# Patient Record
Sex: Male | Born: 1945 | Race: White | Hispanic: No | Marital: Married
Health system: Southern US, Community
[De-identification: ages and names within clinical notes are randomized; demographics above are authoritative.]

---

## 2000-10-16 ENCOUNTER — Encounter: Payer: Self-pay | Admitting: Gastroenterology

## 2000-10-16 ENCOUNTER — Encounter: Admission: RE | Admit: 2000-10-16 | Discharge: 2000-10-16 | Payer: Self-pay | Admitting: Gastroenterology

## 2000-11-11 ENCOUNTER — Encounter: Payer: Self-pay | Admitting: Urology

## 2000-11-11 ENCOUNTER — Ambulatory Visit (HOSPITAL_COMMUNITY): Admission: RE | Admit: 2000-11-11 | Discharge: 2000-11-11 | Payer: Self-pay | Admitting: Urology

## 2002-09-19 ENCOUNTER — Ambulatory Visit (HOSPITAL_COMMUNITY): Admission: RE | Admit: 2002-09-19 | Discharge: 2002-09-19 | Payer: Self-pay | Admitting: Gastroenterology

## 2008-03-06 ENCOUNTER — Encounter: Admission: RE | Admit: 2008-03-06 | Discharge: 2008-03-06 | Payer: Self-pay | Admitting: Gastroenterology

## 2011-04-04 NOTE — Op Note (Signed)
   NAME:  Joshua, [name]                           ACCOUNT NO.:  [id number]   MEDICAL RECORD NO.:  [id number]                   PATIENT TYPE:  AMB   LOCATION:  ENDO                                 FACILITY:  [facility]   PHYSICIAN:  Danise Edge, M.D.                DATE OF BIRTH:  05-29-1946   DATE OF PROCEDURE:  09/19/2002  DATE OF DISCHARGE:                                 OPERATIVE REPORT   PROCEDURE:  Colonoscopy.   PROCEDURE INDICATION:  Mr. Joshua [name] is a 65 year old male, born 07-26-1946.  Mr. Joshua [name] passes fresh blood when he is constipated.  Approximately 10 years ago, proctocolonoscopy to the cecum was normal.   ENDOSCOPIST:  Charolett Bumpers, M.D.   PREMEDICATION:  Versed 10 mg, Demerol 50 mg.   ENDOSCOPE:  Olympus pediatric colonoscope.   DESCRIPTION OF PROCEDURE:  After obtaining informed consent, [name] was  placed in the left lateral decubitus position.  I administered intravenous  Demerol and intravenous Versed to achieve conscious sedation for the  procedure.  The patient's blood pressure, oxygen saturation, and cardiac  rhythm were monitored throughout the procedure and documented in the medical  record.   Anal inspection was normal.  Digital rectal examination was normal.  The  Olympus pediatric video colonoscope was introduced into the rectum and  advanced to the cecum.  Colonic preparation for the exam today was  excellent.   RECTUM:  Normal.  SIGMOID COLON AND DESCENDING COLON:  Normal.  SPLENIC FLEXURE:  Normal.  TRANSVERSE COLON:  Normal.  HEPATIC FLEXURE:  Normal.  ASCENDING COLON:  Normal.  CECUM AND ILEOCECAL VALVE:  Normal.   ASSESSMENT:  Normal proctocolonoscopy to the cecum.                                               Danise Edge, M.D.    MJ/MEDQ  D:  09/19/2002  T:  09/19/2002  Job:  [id number]

## 2012-08-07 ENCOUNTER — Ambulatory Visit (INDEPENDENT_AMBULATORY_CARE_PROVIDER_SITE_OTHER): Payer: MEDICARE | Admitting: *Deleted

## 2012-08-07 DIAGNOSIS — Z23 Encounter for immunization: Secondary | ICD-10-CM

## 2013-08-08 ENCOUNTER — Ambulatory Visit (INDEPENDENT_AMBULATORY_CARE_PROVIDER_SITE_OTHER): Payer: Medicare Other | Admitting: *Deleted

## 2013-08-08 DIAGNOSIS — Z23 Encounter for immunization: Secondary | ICD-10-CM

## 2014-08-17 ENCOUNTER — Ambulatory Visit (INDEPENDENT_AMBULATORY_CARE_PROVIDER_SITE_OTHER): Payer: Medicare Other | Admitting: Radiology

## 2014-08-17 DIAGNOSIS — Z23 Encounter for immunization: Secondary | ICD-10-CM

## 2015-01-31 DIAGNOSIS — M4806 Spinal stenosis, lumbar region: Secondary | ICD-10-CM | POA: Diagnosis not present

## 2015-01-31 DIAGNOSIS — Z23 Encounter for immunization: Secondary | ICD-10-CM | POA: Diagnosis not present

## 2015-01-31 DIAGNOSIS — Z86010 Personal history of colonic polyps: Secondary | ICD-10-CM | POA: Diagnosis not present

## 2015-01-31 DIAGNOSIS — Z0001 Encounter for general adult medical examination with abnormal findings: Secondary | ICD-10-CM | POA: Diagnosis not present

## 2015-01-31 DIAGNOSIS — Z136 Encounter for screening for cardiovascular disorders: Secondary | ICD-10-CM | POA: Diagnosis not present

## 2015-08-29 ENCOUNTER — Ambulatory Visit (INDEPENDENT_AMBULATORY_CARE_PROVIDER_SITE_OTHER): Payer: Medicare Other | Admitting: *Deleted

## 2015-08-29 DIAGNOSIS — Z23 Encounter for immunization: Secondary | ICD-10-CM | POA: Diagnosis not present

## 2016-02-07 DIAGNOSIS — I10 Essential (primary) hypertension: Secondary | ICD-10-CM | POA: Diagnosis not present

## 2016-02-07 DIAGNOSIS — Z1389 Encounter for screening for other disorder: Secondary | ICD-10-CM | POA: Diagnosis not present

## 2016-02-07 DIAGNOSIS — Z0000 Encounter for general adult medical examination without abnormal findings: Secondary | ICD-10-CM | POA: Diagnosis not present

## 2016-02-07 DIAGNOSIS — Z23 Encounter for immunization: Secondary | ICD-10-CM | POA: Diagnosis not present

## 2016-02-07 DIAGNOSIS — Z86010 Personal history of colonic polyps: Secondary | ICD-10-CM | POA: Diagnosis not present

## 2016-03-06 DIAGNOSIS — I10 Essential (primary) hypertension: Secondary | ICD-10-CM | POA: Diagnosis not present

## 2016-06-24 DIAGNOSIS — D1800 Hemangioma unspecified site: Secondary | ICD-10-CM | POA: Diagnosis not present

## 2016-06-24 DIAGNOSIS — D229 Melanocytic nevi, unspecified: Secondary | ICD-10-CM | POA: Diagnosis not present

## 2016-06-24 DIAGNOSIS — D485 Neoplasm of uncertain behavior of skin: Secondary | ICD-10-CM | POA: Diagnosis not present

## 2016-06-24 DIAGNOSIS — D2330 Other benign neoplasm of skin of unspecified part of face: Secondary | ICD-10-CM | POA: Diagnosis not present

## 2016-06-25 DIAGNOSIS — D1801 Hemangioma of skin and subcutaneous tissue: Secondary | ICD-10-CM | POA: Diagnosis not present

## 2016-08-01 ENCOUNTER — Ambulatory Visit (INDEPENDENT_AMBULATORY_CARE_PROVIDER_SITE_OTHER): Payer: Medicare Other

## 2016-08-01 DIAGNOSIS — Z23 Encounter for immunization: Secondary | ICD-10-CM | POA: Diagnosis not present

## 2016-08-12 DIAGNOSIS — D2239 Melanocytic nevi of other parts of face: Secondary | ICD-10-CM | POA: Diagnosis not present

## 2016-11-21 DIAGNOSIS — L308 Other specified dermatitis: Secondary | ICD-10-CM | POA: Diagnosis not present

## 2016-12-30 DIAGNOSIS — L308 Other specified dermatitis: Secondary | ICD-10-CM | POA: Diagnosis not present

## 2016-12-30 DIAGNOSIS — Z23 Encounter for immunization: Secondary | ICD-10-CM | POA: Diagnosis not present

## 2016-12-30 DIAGNOSIS — D485 Neoplasm of uncertain behavior of skin: Secondary | ICD-10-CM | POA: Diagnosis not present

## 2017-01-01 DIAGNOSIS — Q828 Other specified congenital malformations of skin: Secondary | ICD-10-CM | POA: Diagnosis not present

## 2017-01-28 DIAGNOSIS — R0683 Snoring: Secondary | ICD-10-CM | POA: Diagnosis not present

## 2017-01-28 DIAGNOSIS — G4700 Insomnia, unspecified: Secondary | ICD-10-CM | POA: Diagnosis not present

## 2017-02-11 DIAGNOSIS — Z0000 Encounter for general adult medical examination without abnormal findings: Secondary | ICD-10-CM | POA: Diagnosis not present

## 2017-02-11 DIAGNOSIS — Z1389 Encounter for screening for other disorder: Secondary | ICD-10-CM | POA: Diagnosis not present

## 2017-02-20 DIAGNOSIS — G4733 Obstructive sleep apnea (adult) (pediatric): Secondary | ICD-10-CM | POA: Diagnosis not present

## 2017-02-23 DIAGNOSIS — G4733 Obstructive sleep apnea (adult) (pediatric): Secondary | ICD-10-CM | POA: Diagnosis not present

## 2017-03-02 DIAGNOSIS — R42 Dizziness and giddiness: Secondary | ICD-10-CM | POA: Diagnosis not present

## 2017-03-04 DIAGNOSIS — D1801 Hemangioma of skin and subcutaneous tissue: Secondary | ICD-10-CM | POA: Diagnosis not present

## 2017-03-04 DIAGNOSIS — L718 Other rosacea: Secondary | ICD-10-CM | POA: Diagnosis not present

## 2017-03-04 DIAGNOSIS — L858 Other specified epidermal thickening: Secondary | ICD-10-CM | POA: Diagnosis not present

## 2017-03-04 DIAGNOSIS — L565 Disseminated superficial actinic porokeratosis (DSAP): Secondary | ICD-10-CM | POA: Diagnosis not present

## 2017-03-04 DIAGNOSIS — L821 Other seborrheic keratosis: Secondary | ICD-10-CM | POA: Diagnosis not present

## 2017-03-04 DIAGNOSIS — L308 Other specified dermatitis: Secondary | ICD-10-CM | POA: Diagnosis not present

## 2017-03-04 DIAGNOSIS — R42 Dizziness and giddiness: Secondary | ICD-10-CM | POA: Diagnosis not present

## 2017-05-11 DIAGNOSIS — G4733 Obstructive sleep apnea (adult) (pediatric): Secondary | ICD-10-CM | POA: Diagnosis not present

## 2017-06-04 DIAGNOSIS — L309 Dermatitis, unspecified: Secondary | ICD-10-CM | POA: Diagnosis not present

## 2017-08-19 DIAGNOSIS — Z23 Encounter for immunization: Secondary | ICD-10-CM | POA: Diagnosis not present

## 2017-08-31 DIAGNOSIS — R42 Dizziness and giddiness: Secondary | ICD-10-CM | POA: Diagnosis not present

## 2019-06-15 ENCOUNTER — Other Ambulatory Visit: Payer: Self-pay | Admitting: Internal Medicine

## 2019-06-15 DIAGNOSIS — R6889 Other general symptoms and signs: Secondary | ICD-10-CM

## 2019-06-15 DIAGNOSIS — I739 Peripheral vascular disease, unspecified: Secondary | ICD-10-CM

## 2019-06-17 ENCOUNTER — Ambulatory Visit: Payer: Medicare Other

## 2019-06-17 ENCOUNTER — Other Ambulatory Visit: Payer: Self-pay

## 2019-06-17 DIAGNOSIS — I739 Peripheral vascular disease, unspecified: Secondary | ICD-10-CM

## 2019-06-17 DIAGNOSIS — R6889 Other general symptoms and signs: Secondary | ICD-10-CM

## 2019-12-09 ENCOUNTER — Ambulatory Visit: Payer: Medicare PPO | Attending: Internal Medicine

## 2019-12-09 DIAGNOSIS — Z23 Encounter for immunization: Secondary | ICD-10-CM | POA: Insufficient documentation

## 2019-12-09 NOTE — Progress Notes (Signed)
   Covid-19 Vaccination Clinic  Name:  Joshua [name]    MRN: [id number] DOB: 05/18/1946  12/09/2019  Joshua [name] was observed post Covid-19 immunization for 15 minutes without incidence. He was provided with Vaccine Information Sheet and instruction to access the V-Safe system.   Joshua [name] was instructed to call 911 with any severe reactions post vaccine: Marland Kitchen Difficulty breathing  . Swelling of your face and throat  . A fast heartbeat  . A bad rash all over your body  . Dizziness and weakness    Immunizations Administered    Name Date Dose VIS Date Route   Pfizer COVID-19 Vaccine 12/09/2019  3:32 PM 0.3 mL 10/28/2019 Intramuscular   Manufacturer: [place]   Lot: [id number]   [place]: [id number]

## 2019-12-30 ENCOUNTER — Ambulatory Visit: Payer: Medicare PPO | Attending: Internal Medicine

## 2019-12-30 DIAGNOSIS — Z23 Encounter for immunization: Secondary | ICD-10-CM | POA: Insufficient documentation

## 2019-12-30 NOTE — Progress Notes (Signed)
   Covid-19 Vaccination Clinic  Name:  Joshua [name]    MRN: [id number] DOB: January 31, 1946  12/30/2019  Mr. Heileman was observed post Covid-19 immunization for 15 minutes without incidence. He was provided with Vaccine Information Sheet and instruction to access the V-Safe system.   Mr. Goetter was instructed to call 911 with any severe reactions post vaccine: Marland Kitchen Difficulty breathing  . Swelling of your face and throat  . A fast heartbeat  . A bad rash all over your body  . Dizziness and weakness    Immunizations Administered    Name Date Dose VIS Date Route   Pfizer COVID-19 Vaccine 12/30/2019 10:29 AM 0.3 mL 10/28/2019 Intramuscular   Manufacturer: [place]   Lot: [id number]   [place]: [id number]

## 2020-03-12 ENCOUNTER — Ambulatory Visit: Payer: Medicare PPO | Admitting: Orthopaedic Surgery

## 2020-03-12 ENCOUNTER — Ambulatory Visit (INDEPENDENT_AMBULATORY_CARE_PROVIDER_SITE_OTHER): Payer: Medicare PPO

## 2020-03-12 ENCOUNTER — Other Ambulatory Visit: Payer: Self-pay

## 2020-03-12 DIAGNOSIS — G8929 Other chronic pain: Secondary | ICD-10-CM

## 2020-03-12 DIAGNOSIS — M25561 Pain in right knee: Secondary | ICD-10-CM

## 2020-03-12 NOTE — Progress Notes (Signed)
Office Visit Note   Patient: Joshua [name]           Date of Birth: April 17, 1946           MRN: [id number] Visit Date: 03/12/2020              Requested by: [name], MD [address] [place],  [place] [id number] PCP: [name], MD   Assessment & Plan: Visit Diagnoses:  1. Chronic pain of right knee     Plan: Since he is doing so well after Dr. Inda Merlin steroid injection in his right knee, we will plan to just follow him conservatively for now.  He talked about his exercise routine and I agree with him being on his stationary bike.  I want him to really not do as much squats as he has been doing but otherwise avoid hills as well.  I would like to see him back in 2 months because I would always consider repeat injection then if needed.  However, if he develops locking catching or swelling and worsening symptoms he will let us know because given the well-maintained joint space in his knee I would recommend a MRI for further diagnostic purposes if needed.  Follow-Up Instructions: Return in about 2 months (around 05/12/2020).   Orders:  Orders Placed This Encounter  Procedures  . XR Knee 1-2 Views Right   No orders of the defined types were placed in this encounter.     Procedures: No procedures performed   Clinical Data: No additional findings.   Subjective: Chief Complaint  Patient presents with  . Right Knee - Pain  Patient is a very nice and pleasant 74 year old gentleman sent from Dr. Inda Merlin to evaluate and treat right knee pain.  He did had pain a year ago after jumping across a ditch and it really bothered him for for about 4 to 6 weeks but then the pain went away.  More recently he has been out walking and he started to get where he cannot walk for due to pain and swelling in his right knee.  He denies any locking catching.  He is very active.  When he saw Dr. Inda Merlin about 2 weeks ago he did have a steroid injection in his right knee and reports immediate  relief from that.  He denies any swelling now and said he is doing better overall.  He is not a diabetic.  He is never had surgery on this knee either.  HPI  Review of Systems There are no acute changes in medical status with no chest pain, shortness of breath, fever, chills, nausea, vomiting  Objective: Vital Signs: There were no vitals taken for this visit.  Physical Exam He is alert and oriented x3 and in no acute distress Ortho Exam Examination of his right knee only shows just some slight discomfort with McMurray's exam to the medial compartment of the knee but his range of motion is full and his knee is ligamentously stable.  There is no effusion.  There is no malalignment.  There is only slight patellofemoral crepitation.  There is slight pain in his knee with hyperflexion. Specialty Comments:  No specialty comments available.  Imaging: XR Knee 1-2 Views Right  Result Date: 03/12/2020 2 views of the right knee show well-maintained joint space with no acute findings at all.  There is no malalignment and the alignment is neutral.    PMFS History: There are no problems to display for this patient.  No past medical history on file.  No family history on file.   Social History   Occupational History  . Not on file  Tobacco Use  . Smoking status: Not on file  Substance and Sexual Activity  . Alcohol use: Not on file  . Drug use: Not on file  . Sexual activity: Not on file

## 2020-03-22 ENCOUNTER — Other Ambulatory Visit: Payer: Self-pay

## 2020-03-22 ENCOUNTER — Telehealth: Payer: Self-pay | Admitting: Orthopaedic Surgery

## 2020-03-22 DIAGNOSIS — G8929 Other chronic pain: Secondary | ICD-10-CM

## 2020-03-22 DIAGNOSIS — M25561 Pain in right knee: Secondary | ICD-10-CM

## 2020-03-22 NOTE — Telephone Encounter (Signed)
Patient called. He would like a MRI done on his knee. His number is [phone]

## 2020-03-22 NOTE — Telephone Encounter (Signed)
Patient aware I have put an order for MRI for him

## 2020-03-26 ENCOUNTER — Telehealth: Payer: Self-pay | Admitting: Orthopaedic Surgery

## 2020-03-26 NOTE — Telephone Encounter (Signed)
Pt called stating he's having an MRI on his knee but the pain has started going down his leg so the pt isn't sure if his knee is really the problem. Pt would like a call back to discuss this more. Pt also stated he has mychart but emailing would be a better wayto contact him if not by phone.   [name] [phone]

## 2020-03-26 NOTE — Telephone Encounter (Signed)
Please advise 

## 2020-04-19 DIAGNOSIS — D225 Melanocytic nevi of trunk: Secondary | ICD-10-CM | POA: Diagnosis not present

## 2020-04-19 DIAGNOSIS — Z85828 Personal history of other malignant neoplasm of skin: Secondary | ICD-10-CM | POA: Diagnosis not present

## 2020-04-19 DIAGNOSIS — L570 Actinic keratosis: Secondary | ICD-10-CM | POA: Diagnosis not present

## 2020-04-19 DIAGNOSIS — D1801 Hemangioma of skin and subcutaneous tissue: Secondary | ICD-10-CM | POA: Diagnosis not present

## 2020-04-19 DIAGNOSIS — L814 Other melanin hyperpigmentation: Secondary | ICD-10-CM | POA: Diagnosis not present

## 2020-04-19 DIAGNOSIS — L565 Disseminated superficial actinic porokeratosis (DSAP): Secondary | ICD-10-CM | POA: Diagnosis not present

## 2020-04-24 ENCOUNTER — Ambulatory Visit
Admission: RE | Admit: 2020-04-24 | Discharge: 2020-04-24 | Disposition: A | Discharge status: Home / Self Care | Payer: Medicare PPO | Source: Ambulatory Visit | Attending: Orthopaedic Surgery | Admitting: Orthopaedic Surgery

## 2020-04-24 ENCOUNTER — Other Ambulatory Visit: Payer: Self-pay

## 2020-04-24 DIAGNOSIS — M25561 Pain in right knee: Secondary | ICD-10-CM

## 2020-05-09 ENCOUNTER — Ambulatory Visit: Payer: Medicare PPO | Admitting: Orthopaedic Surgery

## 2020-05-09 ENCOUNTER — Other Ambulatory Visit: Payer: Self-pay

## 2020-05-09 ENCOUNTER — Encounter: Payer: Self-pay | Admitting: Orthopaedic Surgery

## 2020-05-09 DIAGNOSIS — S83241D Other tear of medial meniscus, current injury, right knee, subsequent encounter: Secondary | ICD-10-CM

## 2020-05-09 DIAGNOSIS — S83241A Other tear of medial meniscus, current injury, right knee, initial encounter: Secondary | ICD-10-CM | POA: Insufficient documentation

## 2020-05-09 NOTE — Progress Notes (Signed)
HPI: Mr. Dirocco returns today to go over the MRI of his right knee.  Again he continues to have pain medial aspect of the right knee no true mechanical symptoms.  He notes pain and swelling in the with ambulation.  He did undergo cortisone injection into the right knee which is giving no significant relief.  MRI right knee dated 04/24/2020 is reviewed with the patient both images and a knee model are used for visualization of the findings.  Is found to have moderate grade chondromalacia lateral patella facet including areas of local focal full-thickness cartilage loss along the periphery of the mid patella.  Trochlea appears well-maintained.  Medial compartment mild arthritic changes.  Lateral compartment well-preserved.  Complex tear involving the posterior horn of the medial meniscus with mild medial meniscal extrusion seen.  Physical exam: Good range of motion of the knee.  Tenderness along medial joint line.  No abnormal warmth or erythema.  Impression: Right knee medial meniscal tear  Plan: Discussed with patient findings and the MRI he like to proceed with a right knee arthroscopy.  He understands risk benefits surgery including but not limited to PE/DVT, wound infection, worsening pain prolonged pain and progression of arthritis.  We will see him back 1 week postop.  He is given Samella Parr card to schedule surgery.  Questions were encouraged and answered by Dr. Ninfa Linden myself

## 2020-05-14 ENCOUNTER — Ambulatory Visit: Payer: Medicare PPO | Admitting: Orthopaedic Surgery

## 2020-05-24 ENCOUNTER — Other Ambulatory Visit: Payer: Self-pay

## 2020-05-24 ENCOUNTER — Ambulatory Visit: Payer: Medicare PPO | Admitting: Sports Medicine

## 2020-05-24 VITALS — BP 162/88 | Ht 71.0 in | Wt 180.0 lb

## 2020-05-24 DIAGNOSIS — S83242A Other tear of medial meniscus, current injury, left knee, initial encounter: Secondary | ICD-10-CM | POA: Diagnosis not present

## 2020-05-25 NOTE — Progress Notes (Signed)
   Subjective:    Patient ID: Joshua [name], male    DOB: [birth year], 74 y.o.   MRN: [id number]  HPI chief complaint: Right knee pain  Very pleasant 74 year old male comes in today for a second opinion on right knee pain.  He injured the right knee in summer 2020 while jumping across a ditch.  At that time he felt some minor pain which was most noticeable with going up and down stairs.  His symptoms eventually improved up until about 3 to 4 months ago.  He then saw his primary care physician who gave him a cortisone injection which helped for about 5 to 6 days but then his pain returned.  He was then seen by Dr. Ninfa Linden and an MRI was ordered which shows a complex tear of the medial meniscus as well as a moderate grade lateral patellar facet arthropathy.  Arthroscopic meniscectomy and chondroplasty was recommended by Dr. Ninfa Linden.  Patient is somewhat hesitant to proceed with surgery.  He localizes his pain to the anterior medial joint line.  He denies locking catching or popping.  He has not noticed any swelling but does endorse stiffness with prolonged sitting.  No prior knee surgeries in the past.  He does have a compression sleeve which he has found to be minimally beneficial.  Past medical history reviewed Medications reviewed Allergies reviewed    Review of Systems As above    Objective:   Physical Exam  Well-developed, well-nourished.  No acute distress.  Awake alert and oriented x3.  Vital signs reviewed  Right knee: Full range of motion.  No obvious effusion.  He does have some tenderness to palpation along the anterior medial joint line.  Negative Thessaly's.  Knee is grossly stable to ligamentous exam.  Trace patellofemoral crepitus.  Neurovascularly intact distally.  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   I have given him a home exercise program to work on quad strengthening and I will see him back in the office in 6 weeks.  Even if he proceeds with surgery I think that presurgical strengthening of his quadriceps muscle will be helpful.  I had a long discussion with him regarding the results of meniscectomy in an arthritic knee.  He is not getting a lot of mechanical symptoms so some of his pain may be from arthritis.  However, if his quality of life remains impaired despite continuing conservative treatment then he may need to reconsider surgery.  I have encouraged him to call with questions or concerns prior to his follow-up visit.

## 2020-05-30 DIAGNOSIS — G4733 Obstructive sleep apnea (adult) (pediatric): Secondary | ICD-10-CM | POA: Diagnosis not present

## 2020-06-21 ENCOUNTER — Ambulatory Visit: Payer: Medicare PPO | Admitting: Sports Medicine

## 2020-06-21 ENCOUNTER — Other Ambulatory Visit: Payer: Self-pay

## 2020-06-21 VITALS — BP 160/90 | Ht 71.0 in | Wt 182.0 lb

## 2020-06-21 DIAGNOSIS — M1711 Unilateral primary osteoarthritis, right knee: Secondary | ICD-10-CM | POA: Diagnosis not present

## 2020-06-21 DIAGNOSIS — S83242A Other tear of medial meniscus, current injury, left knee, initial encounter: Secondary | ICD-10-CM | POA: Diagnosis not present

## 2020-06-21 NOTE — Progress Notes (Signed)
Office Visit Note   Patient: Joshua [name]           Date of Birth: [birth year]           MRN: [id number] Visit Date: 06/21/2020 Requested by: [name], MD [address] [place],  [place] [address] PCP: [name], MD  Subjective: Follow up for Chronic Right Knee Pain  HPI:  74 year old male presenting to clinic today to follow-up on concerns of chronic right knee pain.  Approximately 1 year ago, patient states he jumped over a brook while on a walk, and felt a sudden discomfort in his right knee.  He states that he "hobbled" for several days, however the pain seemed to resolve on its own after the initial presentation.  Approximately 4 months ago, the pain suddenly returned unprompted.  He has tried ice, as well as home exercises, using his stationary bike, cortisone injection, none of which offered significant improvement in his symptoms.  Patient describes that he is most bothered with bending his knee, or other squat like activities.  He endorses a painful "stabbing" sensation in the anterior medial aspect of his right knee.  Denies any catching sensation of the knee. He states he is very frustrated with how his knee is recovering, as he feels it is holding him back from his daily activity.  Previously, he was an avid walker throughout the day, and did his best to stay active.  He is worried that his knee pain is stopped him from his activity.               ROS:   All other systems were reviewed and are negative.  Objective: Vital Signs: BP (!) 160/90   Ht 5\' 11"  (1.803 m)   Wt 182 lb (82.6 kg)   BMI 25.38 kg/m   Physical Exam:  General:  Alert and oriented, in no acute distress. Cardiac: Appears well perfused. Pulm:  Breathing unlabored. Psy:  Normal mood, congruent affect. Skin:  No swelling, bruising, or rashes appreciated. Right knee: Normal gait, no obvious effusions appreciated. Mild patellar crepitus appreciated bilaterally with knee flexion and  extension. Pain with palpation of medial joint line on right.  No pain with palpation of lateral joint line. No pain or laxity with anterior or posterior drawer.  Similarly, no pain or laxity with varus or valgus stress across the knee. No obvious clicking or pain with McMurray's of right knee. Patient endorses popping sensation medially with Thessaly's testing. Right leg neurovascularly intact.  Imaging: None today.  Assessment & Plan: 74 year old male presenting to clinic today to follow-up on chronic right knee pain.  Patient with MRI displaying medial meniscal tear on right as well as arthritic disease and cartilage fissures beneath the patella.  At this time, it is possible his symptoms are mixed picture between arthritic pain and known meniscal injury.  Patient has been compliant with conservative therapy, however this has failed to offer adequate improvement in his pain.  Given his frustration, and inability to perform his daily walks, and daily exercise, it is reasonable for the patient to consider scope at this time. -Discussed follow-up with surgical team, to which patient is agreeable.  He will reach out to establish surgeon to schedule arthroscopy. -Patient is encouraged to return to clinic should he have any further questions or concerns.   Patient seen and evaluated with the sports medicine fellow.  I agree with the above plan of care.  Patient will follow up with  Dr. Ninfa Linden to discuss merits of arthroscopy and I will defer further treatment to his discretion.

## 2020-07-09 DIAGNOSIS — Z7189 Other specified counseling: Secondary | ICD-10-CM | POA: Diagnosis not present

## 2020-07-09 DIAGNOSIS — E559 Vitamin D deficiency, unspecified: Secondary | ICD-10-CM | POA: Diagnosis not present

## 2020-07-09 DIAGNOSIS — Z0000 Encounter for general adult medical examination without abnormal findings: Secondary | ICD-10-CM | POA: Diagnosis not present

## 2020-07-09 DIAGNOSIS — R030 Elevated blood-pressure reading, without diagnosis of hypertension: Secondary | ICD-10-CM | POA: Diagnosis not present

## 2020-07-09 DIAGNOSIS — G4700 Insomnia, unspecified: Secondary | ICD-10-CM | POA: Diagnosis not present

## 2020-07-09 DIAGNOSIS — Z1389 Encounter for screening for other disorder: Secondary | ICD-10-CM | POA: Diagnosis not present

## 2020-07-09 DIAGNOSIS — I739 Peripheral vascular disease, unspecified: Secondary | ICD-10-CM | POA: Diagnosis not present

## 2020-07-09 DIAGNOSIS — E538 Deficiency of other specified B group vitamins: Secondary | ICD-10-CM | POA: Diagnosis not present

## 2020-07-09 DIAGNOSIS — R7309 Other abnormal glucose: Secondary | ICD-10-CM | POA: Diagnosis not present

## 2020-07-09 DIAGNOSIS — R202 Paresthesia of skin: Secondary | ICD-10-CM | POA: Diagnosis not present

## 2020-07-09 DIAGNOSIS — G4733 Obstructive sleep apnea (adult) (pediatric): Secondary | ICD-10-CM | POA: Diagnosis not present

## 2020-08-02 ENCOUNTER — Encounter: Payer: Self-pay | Admitting: Family Medicine

## 2020-08-02 ENCOUNTER — Other Ambulatory Visit: Payer: Self-pay | Admitting: Orthopaedic Surgery

## 2020-08-02 DIAGNOSIS — M23221 Derangement of posterior horn of medial meniscus due to old tear or injury, right knee: Secondary | ICD-10-CM | POA: Diagnosis not present

## 2020-08-02 DIAGNOSIS — S83231A Complex tear of medial meniscus, current injury, right knee, initial encounter: Secondary | ICD-10-CM | POA: Diagnosis not present

## 2020-08-02 DIAGNOSIS — M948X6 Other specified disorders of cartilage, lower leg: Secondary | ICD-10-CM | POA: Diagnosis not present

## 2020-08-02 MED ORDER — HYDROCODONE-ACETAMINOPHEN 5-325 MG PO TABS: 1.0000 | ORAL_TABLET | Freq: Four times a day (QID) | ORAL | 0 refills | Status: AC | PRN

## 2020-08-08 ENCOUNTER — Encounter: Payer: Self-pay | Admitting: Orthopaedic Surgery

## 2020-08-08 ENCOUNTER — Ambulatory Visit (INDEPENDENT_AMBULATORY_CARE_PROVIDER_SITE_OTHER): Payer: Medicare PPO | Admitting: Orthopaedic Surgery

## 2020-08-08 DIAGNOSIS — S83241D Other tear of medial meniscus, current injury, right knee, subsequent encounter: Secondary | ICD-10-CM

## 2020-08-08 DIAGNOSIS — Z9889 Other specified postprocedural states: Secondary | ICD-10-CM

## 2020-08-08 NOTE — Progress Notes (Signed)
The patient comes in today 1 week status post a right knee arthroscopy with a partial lateral meniscectomy.  He is an active 74 year old gentleman reports that he is doing well overall.  On examination his incisions look good his remove the sutures.  He does have a moderate right knee joint effusion postoperatively.  I was able to aspirate 60 cc of fluid from the knee.  Talked about his knee in detail and I went over the arthroscopy pictures with him.  He will work on activity modification and quad strengthening exercises and will increase his activities as comfort allows.  We will see him back in 4 weeks to make sure he is doing well.

## 2020-09-05 ENCOUNTER — Encounter: Payer: Self-pay | Admitting: Orthopaedic Surgery

## 2020-09-05 ENCOUNTER — Ambulatory Visit (INDEPENDENT_AMBULATORY_CARE_PROVIDER_SITE_OTHER): Payer: Medicare PPO | Admitting: Orthopaedic Surgery

## 2020-09-05 VITALS — Ht 71.0 in | Wt 182.0 lb

## 2020-09-05 DIAGNOSIS — Z9889 Other specified postprocedural states: Secondary | ICD-10-CM

## 2020-09-05 NOTE — Progress Notes (Signed)
Patient is now around 5 weeks status post a right knee arthroscopy with a partial medial meniscectomy.  He is a very active 74 year old gentleman.  He says he still having moderate achy pain and he would like to have home exercise program prescribed to him.  Examination of his right operative knee does show moderate effusion.  I was able to aspirate 60 to 70 cc of fluid from his knee and then place a steroid injection in it.  He is already got good relief overall and good range of motion.  I was able to show him quad strengthening exercises to try at home.  All questions and concerns were answered and addressed.  At this point follow-up can be as needed.  He understands that if he has recurrence of his effusion or any other issues to let us know and come see [place].

## 2021-01-07 DIAGNOSIS — H2513 Age-related nuclear cataract, bilateral: Secondary | ICD-10-CM | POA: Diagnosis not present

## 2021-01-07 DIAGNOSIS — H5203 Hypermetropia, bilateral: Secondary | ICD-10-CM | POA: Diagnosis not present

## 2021-02-13 DIAGNOSIS — Z8249 Family history of ischemic heart disease and other diseases of the circulatory system: Secondary | ICD-10-CM | POA: Diagnosis not present

## 2021-02-13 DIAGNOSIS — Z809 Family history of malignant neoplasm, unspecified: Secondary | ICD-10-CM | POA: Diagnosis not present

## 2021-02-13 DIAGNOSIS — M1990 Unspecified osteoarthritis, unspecified site: Secondary | ICD-10-CM | POA: Diagnosis not present

## 2021-02-13 DIAGNOSIS — Z7982 Long term (current) use of aspirin: Secondary | ICD-10-CM | POA: Diagnosis not present

## 2021-02-13 DIAGNOSIS — Z833 Family history of diabetes mellitus: Secondary | ICD-10-CM | POA: Diagnosis not present

## 2021-02-13 DIAGNOSIS — R030 Elevated blood-pressure reading, without diagnosis of hypertension: Secondary | ICD-10-CM | POA: Diagnosis not present

## 2021-02-13 DIAGNOSIS — G4733 Obstructive sleep apnea (adult) (pediatric): Secondary | ICD-10-CM | POA: Diagnosis not present

## 2021-02-13 DIAGNOSIS — Z823 Family history of stroke: Secondary | ICD-10-CM | POA: Diagnosis not present

## 2021-02-13 DIAGNOSIS — N529 Male erectile dysfunction, unspecified: Secondary | ICD-10-CM | POA: Diagnosis not present

## 2021-04-10 DIAGNOSIS — G4733 Obstructive sleep apnea (adult) (pediatric): Secondary | ICD-10-CM | POA: Diagnosis not present

## 2021-04-30 DIAGNOSIS — D1801 Hemangioma of skin and subcutaneous tissue: Secondary | ICD-10-CM | POA: Diagnosis not present

## 2021-04-30 DIAGNOSIS — L814 Other melanin hyperpigmentation: Secondary | ICD-10-CM | POA: Diagnosis not present

## 2021-04-30 DIAGNOSIS — L570 Actinic keratosis: Secondary | ICD-10-CM | POA: Diagnosis not present

## 2021-04-30 DIAGNOSIS — D0422 Carcinoma in situ of skin of left ear and external auricular canal: Secondary | ICD-10-CM | POA: Diagnosis not present

## 2021-04-30 DIAGNOSIS — D485 Neoplasm of uncertain behavior of skin: Secondary | ICD-10-CM | POA: Diagnosis not present

## 2021-04-30 DIAGNOSIS — Z85828 Personal history of other malignant neoplasm of skin: Secondary | ICD-10-CM | POA: Diagnosis not present

## 2021-04-30 DIAGNOSIS — D225 Melanocytic nevi of trunk: Secondary | ICD-10-CM | POA: Diagnosis not present

## 2021-04-30 DIAGNOSIS — L565 Disseminated superficial actinic porokeratosis (DSAP): Secondary | ICD-10-CM | POA: Diagnosis not present

## 2021-05-30 DIAGNOSIS — G4733 Obstructive sleep apnea (adult) (pediatric): Secondary | ICD-10-CM | POA: Diagnosis not present

## 2021-07-12 DIAGNOSIS — H1033 Unspecified acute conjunctivitis, bilateral: Secondary | ICD-10-CM | POA: Diagnosis not present

## 2021-08-08 DIAGNOSIS — I739 Peripheral vascular disease, unspecified: Secondary | ICD-10-CM | POA: Diagnosis not present

## 2021-08-08 DIAGNOSIS — M25561 Pain in right knee: Secondary | ICD-10-CM | POA: Diagnosis not present

## 2021-08-08 DIAGNOSIS — Z23 Encounter for immunization: Secondary | ICD-10-CM | POA: Diagnosis not present

## 2021-08-08 DIAGNOSIS — Z0000 Encounter for general adult medical examination without abnormal findings: Secondary | ICD-10-CM | POA: Diagnosis not present

## 2021-08-08 DIAGNOSIS — M4800 Spinal stenosis, site unspecified: Secondary | ICD-10-CM | POA: Diagnosis not present

## 2021-08-08 DIAGNOSIS — G4700 Insomnia, unspecified: Secondary | ICD-10-CM | POA: Diagnosis not present

## 2021-08-08 DIAGNOSIS — Z1211 Encounter for screening for malignant neoplasm of colon: Secondary | ICD-10-CM | POA: Diagnosis not present

## 2021-08-08 DIAGNOSIS — G4733 Obstructive sleep apnea (adult) (pediatric): Secondary | ICD-10-CM | POA: Diagnosis not present

## 2021-08-08 DIAGNOSIS — R202 Paresthesia of skin: Secondary | ICD-10-CM | POA: Diagnosis not present

## 2021-08-08 DIAGNOSIS — R030 Elevated blood-pressure reading, without diagnosis of hypertension: Secondary | ICD-10-CM | POA: Diagnosis not present

## 2021-08-08 DIAGNOSIS — Z1389 Encounter for screening for other disorder: Secondary | ICD-10-CM | POA: Diagnosis not present

## 2021-08-08 DIAGNOSIS — R7309 Other abnormal glucose: Secondary | ICD-10-CM | POA: Diagnosis not present

## 2021-08-08 DIAGNOSIS — E559 Vitamin D deficiency, unspecified: Secondary | ICD-10-CM | POA: Diagnosis not present

## 2021-11-19 DIAGNOSIS — R030 Elevated blood-pressure reading, without diagnosis of hypertension: Secondary | ICD-10-CM | POA: Diagnosis not present

## 2021-11-19 DIAGNOSIS — R350 Frequency of micturition: Secondary | ICD-10-CM | POA: Diagnosis not present

## 2021-11-19 DIAGNOSIS — K439 Ventral hernia without obstruction or gangrene: Secondary | ICD-10-CM | POA: Diagnosis not present

## 2021-11-19 DIAGNOSIS — R739 Hyperglycemia, unspecified: Secondary | ICD-10-CM | POA: Diagnosis not present

## 2021-11-19 DIAGNOSIS — E538 Deficiency of other specified B group vitamins: Secondary | ICD-10-CM | POA: Diagnosis not present

## 2021-11-19 DIAGNOSIS — Z658 Other specified problems related to psychosocial circumstances: Secondary | ICD-10-CM | POA: Diagnosis not present

## 2021-11-19 DIAGNOSIS — K429 Umbilical hernia without obstruction or gangrene: Secondary | ICD-10-CM | POA: Diagnosis not present

## 2021-11-25 IMAGING — MR MR KNEE*R* W/O CM
4 of 7 series · 22 of 40 positions shown · non-contrast
Comparison: X-ray 03/12/2020

CLINICAL DATA: Medial right knee pain

EXAM:
MRI OF THE RIGHT KNEE WITHOUT CONTRAST
TECHNIQUE: Multiplanar, multisequence MR imaging of the knee was performed. No
intravenous contrast was administered.

[Series 3: T2 fat-sat · axial · 4.0mm · 0.50mm/px · z∈[-82,+48]mm · 5 of 27 slices shown]
[im 1/27]
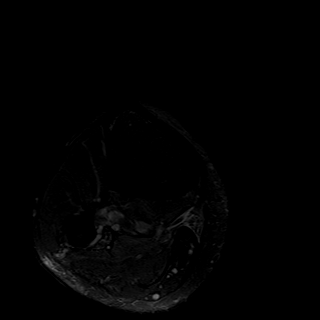
[im 6/27]
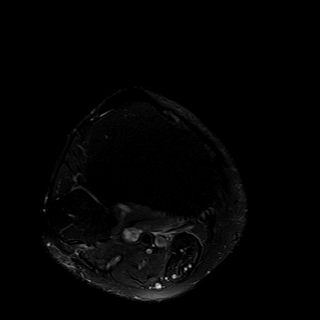
[im 11/27]
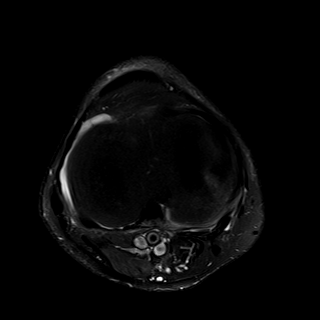
[im 16/27]
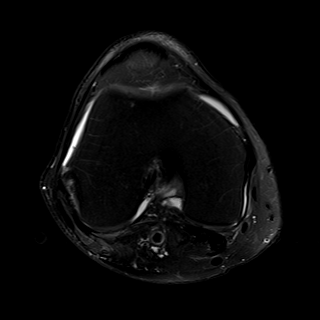
[im 27/27]
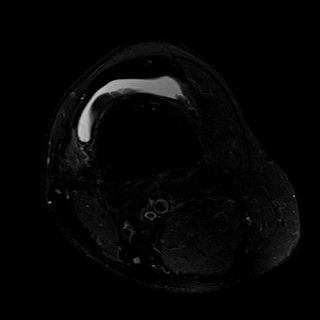

[Series 7: PD fat-sat · sagittal · 3.0mm · 0.29mm/px · 7 of 29 slices shown (1 of 3)]
[im 1/29]
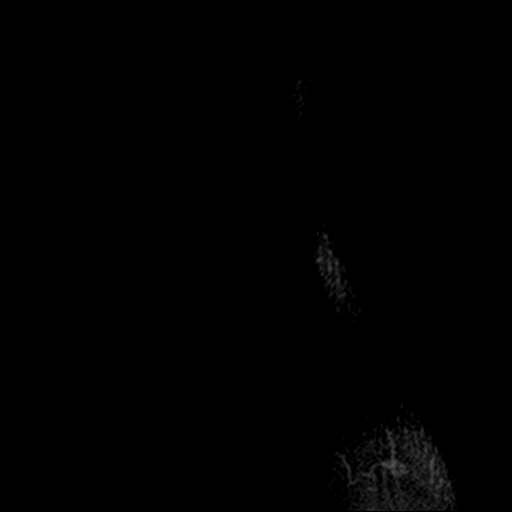
[im 5/29]
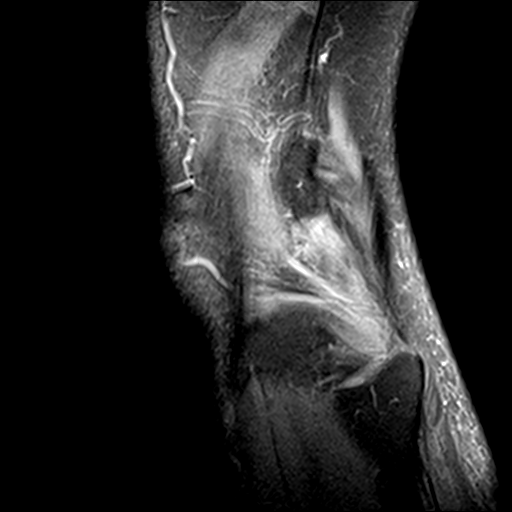
[im 10/29]
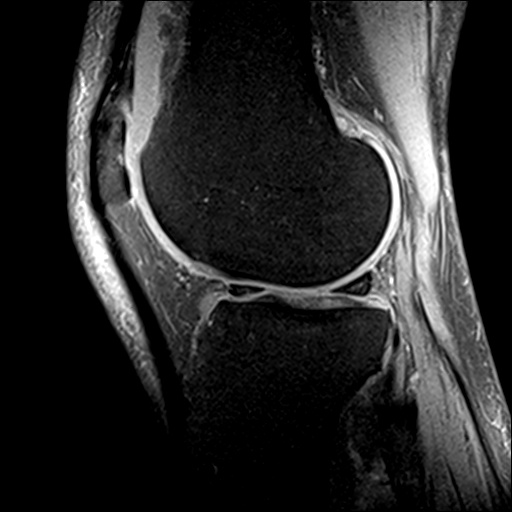
[im 15/29]
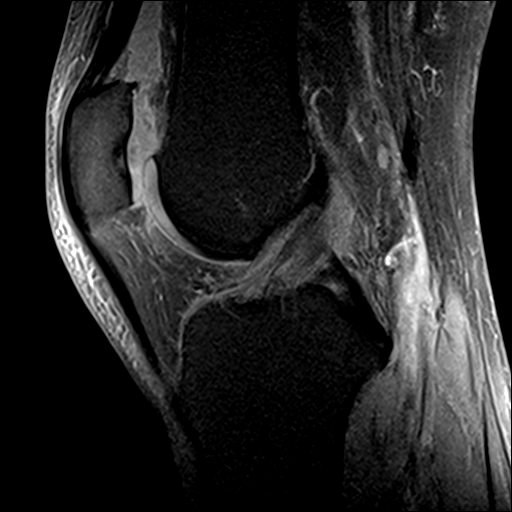
[im 19/29]
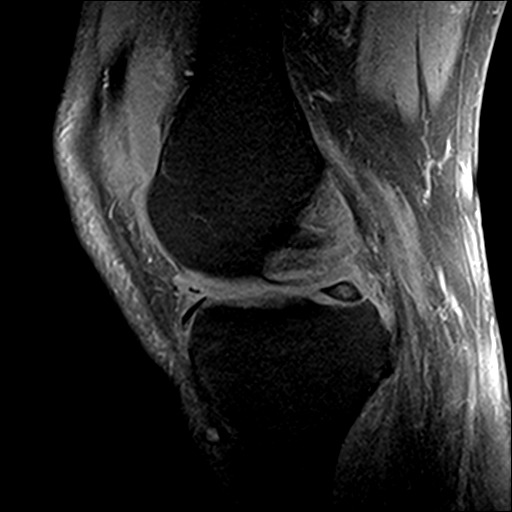
[im 24/29]
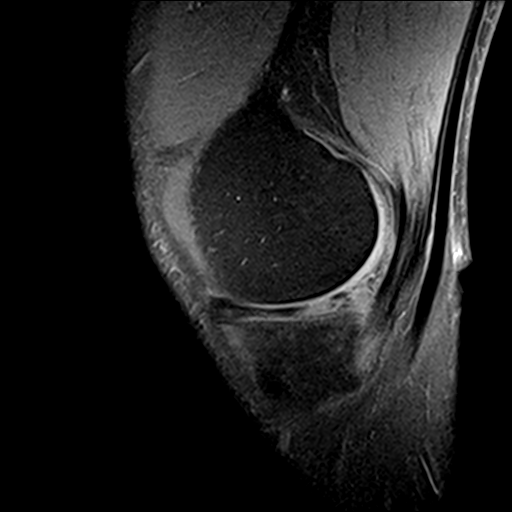
[im 29/29]
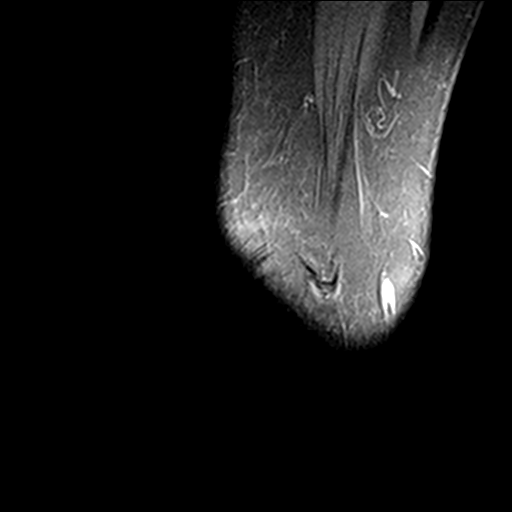

[Series 8: PD fat-sat · coronal · 3.0mm · 0.29mm/px · 7 of 31 slices shown (2 of 3)]
[im 1/31]
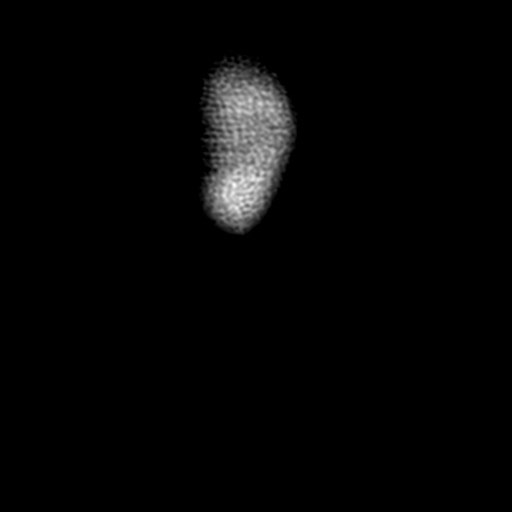
[im 6/31]
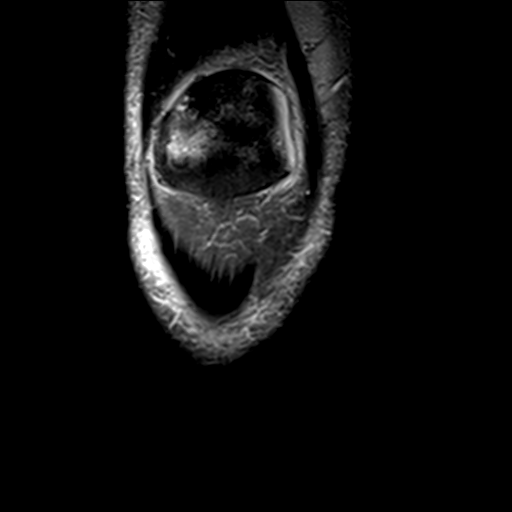
[im 11/31]
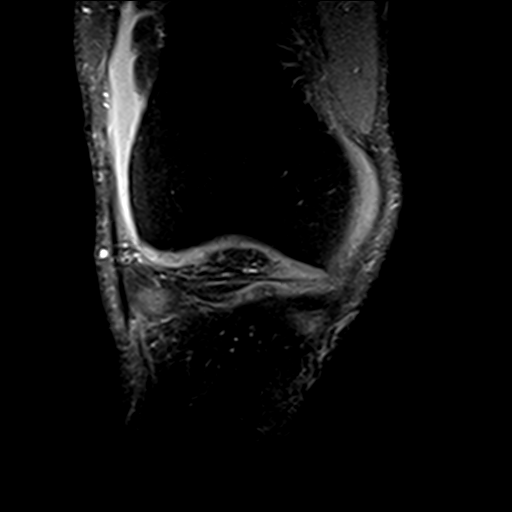
[im 16/31]
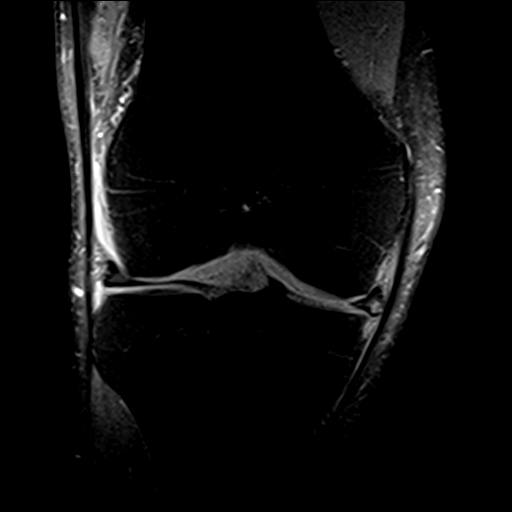
[im 21/31]
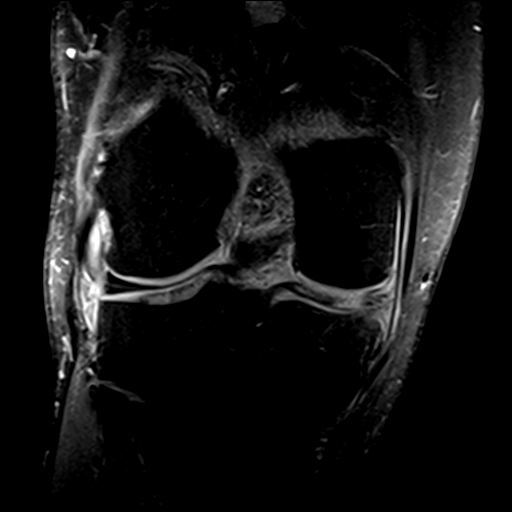
[im 26/31]
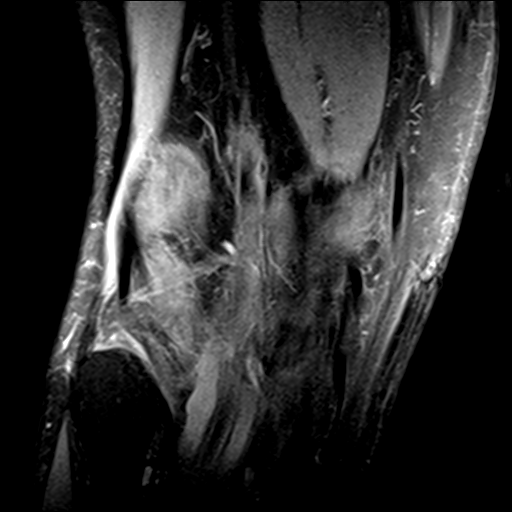
[im 31/31]
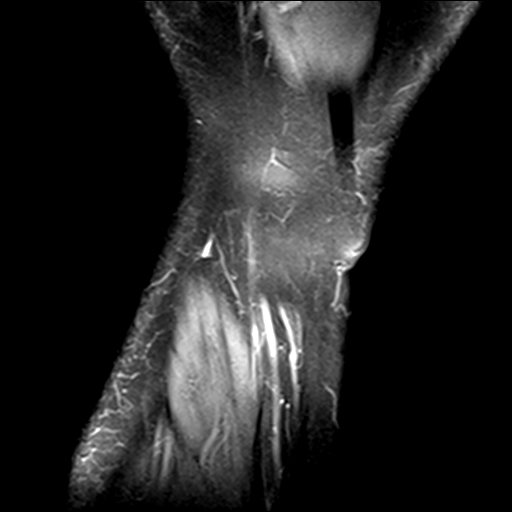

[Series 9: PD fat-sat · coronal · 2.3mm · 0.29mm/px · 3 of 11 slices shown (3 of 3)]
[im 1/11]
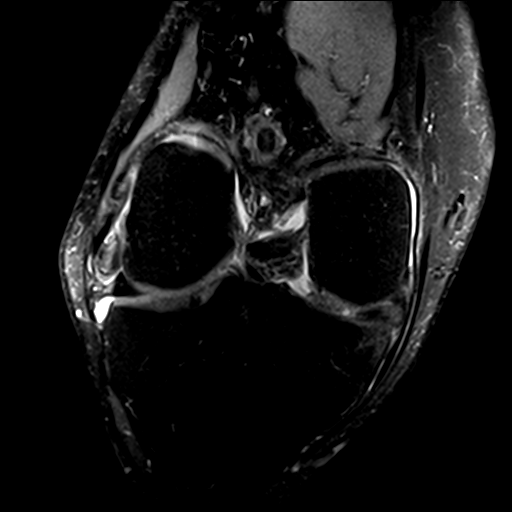
[im 6/11]
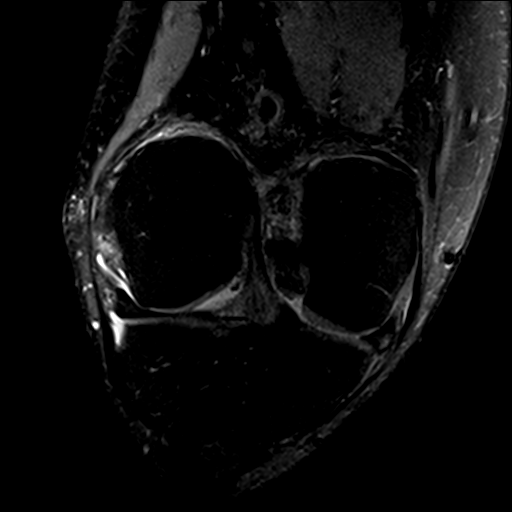
[im 11/11]
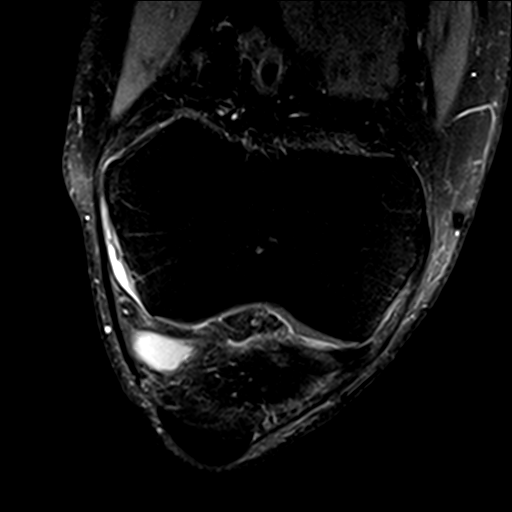

[22 of 40 positions shown; findings below may reference images not displayed]

FINDINGS: MENISCI

Medial meniscus: Complex tearing of the posterior horn-body junction
of the medial meniscus with prominent radial component (series 5,
images 8-9; series 3, image 16). Background of intrasubstance
meniscal degeneration. Mild medial meniscal extrusion.

Lateral meniscus:  Intact.

LIGAMENTS

Cruciates:  Intact ACL and PCL.

Collaterals: Medial collateral ligament is intact. Lateral
collateral ligament complex is intact.

CARTILAGE

Patellofemoral: Moderate grade chondral loss of the lateral patellar
facet including a focal full-thickness cartilage fissure at the
periphery of the mid patella (series 3, image 8). No focal trochlear
chondral defect.

Medial: Mild chondral thinning of the weight-bearing medial
compartment.

Lateral:  No chondral defect.

Joint:  Moderate knee joint effusion.  Unremarkable fat pads.

Popliteal Fossa:  Distal popliteus tendinosis.  No Baker's cyst.

Extensor Mechanism:  Intact quadriceps tendon and patellar tendon.

Bones: Reactive subchondral marrow signal changes within the lateral
patella. No acute fracture. No malalignment. No suspicious bone
lesion.

Other: None.
IMPRESSION: 1. Complex tearing of the posterior horn-body junction of the medial
meniscus.
2. Moderate grade chondral loss of the lateral patellar facet
including a focal full-thickness cartilage fissure at the periphery
of the mid patella.
3. Moderate knee joint effusion.
4. Distal popliteus tendinosis.

## 2021-12-19 DIAGNOSIS — R030 Elevated blood-pressure reading, without diagnosis of hypertension: Secondary | ICD-10-CM | POA: Diagnosis not present

## 2021-12-19 DIAGNOSIS — R739 Hyperglycemia, unspecified: Secondary | ICD-10-CM | POA: Diagnosis not present

## 2021-12-19 DIAGNOSIS — Z658 Other specified problems related to psychosocial circumstances: Secondary | ICD-10-CM | POA: Diagnosis not present

## 2021-12-19 DIAGNOSIS — R350 Frequency of micturition: Secondary | ICD-10-CM | POA: Diagnosis not present

## 2021-12-19 DIAGNOSIS — E538 Deficiency of other specified B group vitamins: Secondary | ICD-10-CM | POA: Diagnosis not present

## 2021-12-25 DIAGNOSIS — Z87891 Personal history of nicotine dependence: Secondary | ICD-10-CM | POA: Diagnosis not present

## 2021-12-25 DIAGNOSIS — Z7722 Contact with and (suspected) exposure to environmental tobacco smoke (acute) (chronic): Secondary | ICD-10-CM | POA: Diagnosis not present

## 2021-12-25 DIAGNOSIS — N529 Male erectile dysfunction, unspecified: Secondary | ICD-10-CM | POA: Diagnosis not present

## 2021-12-25 DIAGNOSIS — G629 Polyneuropathy, unspecified: Secondary | ICD-10-CM | POA: Diagnosis not present

## 2021-12-25 DIAGNOSIS — Z7982 Long term (current) use of aspirin: Secondary | ICD-10-CM | POA: Diagnosis not present

## 2021-12-25 DIAGNOSIS — I739 Peripheral vascular disease, unspecified: Secondary | ICD-10-CM | POA: Diagnosis not present

## 2021-12-25 DIAGNOSIS — G4700 Insomnia, unspecified: Secondary | ICD-10-CM | POA: Diagnosis not present

## 2021-12-25 DIAGNOSIS — G4733 Obstructive sleep apnea (adult) (pediatric): Secondary | ICD-10-CM | POA: Diagnosis not present

## 2021-12-25 DIAGNOSIS — R030 Elevated blood-pressure reading, without diagnosis of hypertension: Secondary | ICD-10-CM | POA: Diagnosis not present

## 2022-01-09 DIAGNOSIS — H5203 Hypermetropia, bilateral: Secondary | ICD-10-CM | POA: Diagnosis not present

## 2022-01-09 DIAGNOSIS — H524 Presbyopia: Secondary | ICD-10-CM | POA: Diagnosis not present

## 2022-01-09 DIAGNOSIS — H2513 Age-related nuclear cataract, bilateral: Secondary | ICD-10-CM | POA: Diagnosis not present

## 2022-04-17 DIAGNOSIS — G4733 Obstructive sleep apnea (adult) (pediatric): Secondary | ICD-10-CM | POA: Diagnosis not present

## 2022-04-30 DIAGNOSIS — D2271 Melanocytic nevi of right lower limb, including hip: Secondary | ICD-10-CM | POA: Diagnosis not present

## 2022-04-30 DIAGNOSIS — D2262 Melanocytic nevi of left upper limb, including shoulder: Secondary | ICD-10-CM | POA: Diagnosis not present

## 2022-04-30 DIAGNOSIS — Z85828 Personal history of other malignant neoplasm of skin: Secondary | ICD-10-CM | POA: Diagnosis not present

## 2022-04-30 DIAGNOSIS — C44719 Basal cell carcinoma of skin of left lower limb, including hip: Secondary | ICD-10-CM | POA: Diagnosis not present

## 2022-04-30 DIAGNOSIS — L821 Other seborrheic keratosis: Secondary | ICD-10-CM | POA: Diagnosis not present

## 2022-04-30 DIAGNOSIS — L570 Actinic keratosis: Secondary | ICD-10-CM | POA: Diagnosis not present

## 2022-04-30 DIAGNOSIS — D225 Melanocytic nevi of trunk: Secondary | ICD-10-CM | POA: Diagnosis not present

## 2022-04-30 DIAGNOSIS — D485 Neoplasm of uncertain behavior of skin: Secondary | ICD-10-CM | POA: Diagnosis not present

## 2022-04-30 DIAGNOSIS — Q828 Other specified congenital malformations of skin: Secondary | ICD-10-CM | POA: Diagnosis not present

## 2022-04-30 DIAGNOSIS — D2261 Melanocytic nevi of right upper limb, including shoulder: Secondary | ICD-10-CM | POA: Diagnosis not present

## 2022-06-04 DIAGNOSIS — E538 Deficiency of other specified B group vitamins: Secondary | ICD-10-CM | POA: Diagnosis not present

## 2022-06-04 DIAGNOSIS — Z7189 Other specified counseling: Secondary | ICD-10-CM | POA: Diagnosis not present

## 2022-06-04 DIAGNOSIS — G629 Polyneuropathy, unspecified: Secondary | ICD-10-CM | POA: Diagnosis not present

## 2022-06-04 DIAGNOSIS — R7301 Impaired fasting glucose: Secondary | ICD-10-CM | POA: Diagnosis not present

## 2022-06-04 DIAGNOSIS — M48061 Spinal stenosis, lumbar region without neurogenic claudication: Secondary | ICD-10-CM | POA: Diagnosis not present

## 2022-06-04 DIAGNOSIS — G4733 Obstructive sleep apnea (adult) (pediatric): Secondary | ICD-10-CM | POA: Diagnosis not present

## 2022-06-04 DIAGNOSIS — G4700 Insomnia, unspecified: Secondary | ICD-10-CM | POA: Diagnosis not present

## 2022-07-24 DIAGNOSIS — G4733 Obstructive sleep apnea (adult) (pediatric): Secondary | ICD-10-CM | POA: Diagnosis not present

## 2022-10-13 DIAGNOSIS — Z125 Encounter for screening for malignant neoplasm of prostate: Secondary | ICD-10-CM | POA: Diagnosis not present

## 2022-10-13 DIAGNOSIS — Z1212 Encounter for screening for malignant neoplasm of rectum: Secondary | ICD-10-CM | POA: Diagnosis not present

## 2022-10-13 DIAGNOSIS — R7301 Impaired fasting glucose: Secondary | ICD-10-CM | POA: Diagnosis not present

## 2022-10-20 DIAGNOSIS — E538 Deficiency of other specified B group vitamins: Secondary | ICD-10-CM | POA: Diagnosis not present

## 2022-10-20 DIAGNOSIS — G4700 Insomnia, unspecified: Secondary | ICD-10-CM | POA: Diagnosis not present

## 2022-10-20 DIAGNOSIS — R82998 Other abnormal findings in urine: Secondary | ICD-10-CM | POA: Diagnosis not present

## 2022-10-20 DIAGNOSIS — R7301 Impaired fasting glucose: Secondary | ICD-10-CM | POA: Diagnosis not present

## 2022-10-20 DIAGNOSIS — R030 Elevated blood-pressure reading, without diagnosis of hypertension: Secondary | ICD-10-CM | POA: Diagnosis not present

## 2022-10-20 DIAGNOSIS — J309 Allergic rhinitis, unspecified: Secondary | ICD-10-CM | POA: Diagnosis not present

## 2022-10-20 DIAGNOSIS — G629 Polyneuropathy, unspecified: Secondary | ICD-10-CM | POA: Diagnosis not present

## 2022-10-20 DIAGNOSIS — Z0000 Encounter for general adult medical examination without abnormal findings: Secondary | ICD-10-CM | POA: Diagnosis not present

## 2022-10-20 DIAGNOSIS — M48061 Spinal stenosis, lumbar region without neurogenic claudication: Secondary | ICD-10-CM | POA: Diagnosis not present

## 2022-10-20 DIAGNOSIS — G4733 Obstructive sleep apnea (adult) (pediatric): Secondary | ICD-10-CM | POA: Diagnosis not present

## 2022-11-30 DIAGNOSIS — G4733 Obstructive sleep apnea (adult) (pediatric): Secondary | ICD-10-CM | POA: Diagnosis not present

## 2022-12-31 DIAGNOSIS — G4733 Obstructive sleep apnea (adult) (pediatric): Secondary | ICD-10-CM | POA: Diagnosis not present

## 2023-01-21 DIAGNOSIS — H5203 Hypermetropia, bilateral: Secondary | ICD-10-CM | POA: Diagnosis not present

## 2023-01-21 DIAGNOSIS — H2513 Age-related nuclear cataract, bilateral: Secondary | ICD-10-CM | POA: Diagnosis not present

## 2023-01-29 DIAGNOSIS — G4733 Obstructive sleep apnea (adult) (pediatric): Secondary | ICD-10-CM | POA: Diagnosis not present

## 2023-02-16 DIAGNOSIS — R7301 Impaired fasting glucose: Secondary | ICD-10-CM | POA: Diagnosis not present

## 2023-02-16 DIAGNOSIS — J309 Allergic rhinitis, unspecified: Secondary | ICD-10-CM | POA: Diagnosis not present

## 2023-02-16 DIAGNOSIS — Z7189 Other specified counseling: Secondary | ICD-10-CM | POA: Diagnosis not present

## 2023-02-16 DIAGNOSIS — G629 Polyneuropathy, unspecified: Secondary | ICD-10-CM | POA: Diagnosis not present

## 2023-02-16 DIAGNOSIS — M79646 Pain in unspecified finger(s): Secondary | ICD-10-CM | POA: Diagnosis not present

## 2023-02-16 DIAGNOSIS — M48061 Spinal stenosis, lumbar region without neurogenic claudication: Secondary | ICD-10-CM | POA: Diagnosis not present

## 2023-02-16 DIAGNOSIS — R030 Elevated blood-pressure reading, without diagnosis of hypertension: Secondary | ICD-10-CM | POA: Diagnosis not present

## 2023-02-16 DIAGNOSIS — E538 Deficiency of other specified B group vitamins: Secondary | ICD-10-CM | POA: Diagnosis not present

## 2023-03-01 DIAGNOSIS — G4733 Obstructive sleep apnea (adult) (pediatric): Secondary | ICD-10-CM | POA: Diagnosis not present

## 2023-03-05 DIAGNOSIS — R030 Elevated blood-pressure reading, without diagnosis of hypertension: Secondary | ICD-10-CM | POA: Diagnosis not present

## 2023-03-09 DIAGNOSIS — R030 Elevated blood-pressure reading, without diagnosis of hypertension: Secondary | ICD-10-CM | POA: Diagnosis not present

## 2023-03-31 DIAGNOSIS — G4733 Obstructive sleep apnea (adult) (pediatric): Secondary | ICD-10-CM | POA: Diagnosis not present

## 2023-04-22 DIAGNOSIS — Z809 Family history of malignant neoplasm, unspecified: Secondary | ICD-10-CM | POA: Diagnosis not present

## 2023-04-22 DIAGNOSIS — Z823 Family history of stroke: Secondary | ICD-10-CM | POA: Diagnosis not present

## 2023-04-22 DIAGNOSIS — M4800 Spinal stenosis, site unspecified: Secondary | ICD-10-CM | POA: Diagnosis not present

## 2023-04-22 DIAGNOSIS — Z87891 Personal history of nicotine dependence: Secondary | ICD-10-CM | POA: Diagnosis not present

## 2023-04-22 DIAGNOSIS — M1990 Unspecified osteoarthritis, unspecified site: Secondary | ICD-10-CM | POA: Diagnosis not present

## 2023-04-22 DIAGNOSIS — I10 Essential (primary) hypertension: Secondary | ICD-10-CM | POA: Diagnosis not present

## 2023-04-22 DIAGNOSIS — G629 Polyneuropathy, unspecified: Secondary | ICD-10-CM | POA: Diagnosis not present

## 2023-04-22 DIAGNOSIS — N529 Male erectile dysfunction, unspecified: Secondary | ICD-10-CM | POA: Diagnosis not present

## 2023-04-22 DIAGNOSIS — J301 Allergic rhinitis due to pollen: Secondary | ICD-10-CM | POA: Diagnosis not present

## 2023-05-01 DIAGNOSIS — G4733 Obstructive sleep apnea (adult) (pediatric): Secondary | ICD-10-CM | POA: Diagnosis not present

## 2023-05-05 DIAGNOSIS — D1801 Hemangioma of skin and subcutaneous tissue: Secondary | ICD-10-CM | POA: Diagnosis not present

## 2023-05-05 DIAGNOSIS — C44311 Basal cell carcinoma of skin of nose: Secondary | ICD-10-CM | POA: Diagnosis not present

## 2023-05-05 DIAGNOSIS — Z85828 Personal history of other malignant neoplasm of skin: Secondary | ICD-10-CM | POA: Diagnosis not present

## 2023-05-05 DIAGNOSIS — L565 Disseminated superficial actinic porokeratosis (DSAP): Secondary | ICD-10-CM | POA: Diagnosis not present

## 2023-05-05 DIAGNOSIS — L738 Other specified follicular disorders: Secondary | ICD-10-CM | POA: Diagnosis not present

## 2023-05-05 DIAGNOSIS — D485 Neoplasm of uncertain behavior of skin: Secondary | ICD-10-CM | POA: Diagnosis not present

## 2023-05-05 DIAGNOSIS — D0362 Melanoma in situ of left upper limb, including shoulder: Secondary | ICD-10-CM | POA: Diagnosis not present

## 2023-05-05 DIAGNOSIS — L570 Actinic keratosis: Secondary | ICD-10-CM | POA: Diagnosis not present

## 2023-05-05 DIAGNOSIS — L821 Other seborrheic keratosis: Secondary | ICD-10-CM | POA: Diagnosis not present

## 2023-05-08 DIAGNOSIS — G4733 Obstructive sleep apnea (adult) (pediatric): Secondary | ICD-10-CM | POA: Diagnosis not present

## 2023-05-25 DIAGNOSIS — L988 Other specified disorders of the skin and subcutaneous tissue: Secondary | ICD-10-CM | POA: Diagnosis not present

## 2023-05-25 DIAGNOSIS — Z85828 Personal history of other malignant neoplasm of skin: Secondary | ICD-10-CM | POA: Diagnosis not present

## 2023-05-25 DIAGNOSIS — D0362 Melanoma in situ of left upper limb, including shoulder: Secondary | ICD-10-CM | POA: Diagnosis not present

## 2023-06-16 DIAGNOSIS — C44311 Basal cell carcinoma of skin of nose: Secondary | ICD-10-CM | POA: Diagnosis not present

## 2023-06-16 DIAGNOSIS — Z85828 Personal history of other malignant neoplasm of skin: Secondary | ICD-10-CM | POA: Diagnosis not present

## 2023-06-23 DIAGNOSIS — R6884 Jaw pain: Secondary | ICD-10-CM | POA: Diagnosis not present

## 2023-07-13 DIAGNOSIS — R070 Pain in throat: Secondary | ICD-10-CM | POA: Diagnosis not present

## 2023-07-13 DIAGNOSIS — H9201 Otalgia, right ear: Secondary | ICD-10-CM | POA: Diagnosis not present

## 2023-07-14 DIAGNOSIS — Z85828 Personal history of other malignant neoplasm of skin: Secondary | ICD-10-CM | POA: Diagnosis not present

## 2023-07-14 DIAGNOSIS — L905 Scar conditions and fibrosis of skin: Secondary | ICD-10-CM | POA: Diagnosis not present

## 2023-07-14 DIAGNOSIS — D1801 Hemangioma of skin and subcutaneous tissue: Secondary | ICD-10-CM | POA: Diagnosis not present

## 2023-07-15 DIAGNOSIS — H9201 Otalgia, right ear: Secondary | ICD-10-CM | POA: Diagnosis not present

## 2023-07-15 DIAGNOSIS — R070 Pain in throat: Secondary | ICD-10-CM | POA: Diagnosis not present

## 2023-07-28 DIAGNOSIS — G4733 Obstructive sleep apnea (adult) (pediatric): Secondary | ICD-10-CM | POA: Diagnosis not present

## 2023-07-28 DIAGNOSIS — R030 Elevated blood-pressure reading, without diagnosis of hypertension: Secondary | ICD-10-CM | POA: Diagnosis not present

## 2023-10-21 DIAGNOSIS — Z125 Encounter for screening for malignant neoplasm of prostate: Secondary | ICD-10-CM | POA: Diagnosis not present

## 2023-10-21 DIAGNOSIS — R7989 Other specified abnormal findings of blood chemistry: Secondary | ICD-10-CM | POA: Diagnosis not present

## 2023-10-28 DIAGNOSIS — R7301 Impaired fasting glucose: Secondary | ICD-10-CM | POA: Diagnosis not present

## 2023-10-28 DIAGNOSIS — J309 Allergic rhinitis, unspecified: Secondary | ICD-10-CM | POA: Diagnosis not present

## 2023-10-28 DIAGNOSIS — G4733 Obstructive sleep apnea (adult) (pediatric): Secondary | ICD-10-CM | POA: Diagnosis not present

## 2023-10-28 DIAGNOSIS — Z1339 Encounter for screening examination for other mental health and behavioral disorders: Secondary | ICD-10-CM | POA: Diagnosis not present

## 2023-10-28 DIAGNOSIS — Z0000 Encounter for general adult medical examination without abnormal findings: Secondary | ICD-10-CM | POA: Diagnosis not present

## 2023-10-28 DIAGNOSIS — M48061 Spinal stenosis, lumbar region without neurogenic claudication: Secondary | ICD-10-CM | POA: Diagnosis not present

## 2023-10-28 DIAGNOSIS — Z1212 Encounter for screening for malignant neoplasm of rectum: Secondary | ICD-10-CM | POA: Diagnosis not present

## 2023-10-28 DIAGNOSIS — E538 Deficiency of other specified B group vitamins: Secondary | ICD-10-CM | POA: Diagnosis not present

## 2023-10-28 DIAGNOSIS — Z1331 Encounter for screening for depression: Secondary | ICD-10-CM | POA: Diagnosis not present

## 2023-10-28 DIAGNOSIS — R030 Elevated blood-pressure reading, without diagnosis of hypertension: Secondary | ICD-10-CM | POA: Diagnosis not present

## 2023-10-28 DIAGNOSIS — G629 Polyneuropathy, unspecified: Secondary | ICD-10-CM | POA: Diagnosis not present

## 2023-10-28 DIAGNOSIS — E039 Hypothyroidism, unspecified: Secondary | ICD-10-CM | POA: Diagnosis not present

## 2023-10-28 DIAGNOSIS — Z23 Encounter for immunization: Secondary | ICD-10-CM | POA: Diagnosis not present

## 2023-10-28 DIAGNOSIS — R82998 Other abnormal findings in urine: Secondary | ICD-10-CM | POA: Diagnosis not present

## 2023-11-09 DIAGNOSIS — L821 Other seborrheic keratosis: Secondary | ICD-10-CM | POA: Diagnosis not present

## 2023-11-09 DIAGNOSIS — Z85828 Personal history of other malignant neoplasm of skin: Secondary | ICD-10-CM | POA: Diagnosis not present

## 2023-11-09 DIAGNOSIS — Z85820 Personal history of malignant melanoma of skin: Secondary | ICD-10-CM | POA: Diagnosis not present

## 2023-11-09 DIAGNOSIS — L570 Actinic keratosis: Secondary | ICD-10-CM | POA: Diagnosis not present

## 2023-11-09 DIAGNOSIS — L438 Other lichen planus: Secondary | ICD-10-CM | POA: Diagnosis not present

## 2023-11-09 DIAGNOSIS — D034 Melanoma in situ of scalp and neck: Secondary | ICD-10-CM | POA: Diagnosis not present

## 2023-11-09 DIAGNOSIS — D225 Melanocytic nevi of trunk: Secondary | ICD-10-CM | POA: Diagnosis not present

## 2023-11-09 DIAGNOSIS — L905 Scar conditions and fibrosis of skin: Secondary | ICD-10-CM | POA: Diagnosis not present

## 2023-11-09 DIAGNOSIS — L814 Other melanin hyperpigmentation: Secondary | ICD-10-CM | POA: Diagnosis not present

## 2023-11-09 DIAGNOSIS — D2272 Melanocytic nevi of left lower limb, including hip: Secondary | ICD-10-CM | POA: Diagnosis not present

## 2023-12-23 DIAGNOSIS — Z85828 Personal history of other malignant neoplasm of skin: Secondary | ICD-10-CM | POA: Diagnosis not present

## 2023-12-23 DIAGNOSIS — D034 Melanoma in situ of scalp and neck: Secondary | ICD-10-CM | POA: Diagnosis not present

## 2024-01-11 DIAGNOSIS — Z860101 Personal history of adenomatous and serrated colon polyps: Secondary | ICD-10-CM | POA: Diagnosis not present

## 2024-01-11 DIAGNOSIS — R21 Rash and other nonspecific skin eruption: Secondary | ICD-10-CM | POA: Diagnosis not present

## 2024-01-25 DIAGNOSIS — H5203 Hypermetropia, bilateral: Secondary | ICD-10-CM | POA: Diagnosis not present

## 2024-01-25 DIAGNOSIS — H2513 Age-related nuclear cataract, bilateral: Secondary | ICD-10-CM | POA: Diagnosis not present

## 2024-02-17 DIAGNOSIS — I10 Essential (primary) hypertension: Secondary | ICD-10-CM | POA: Diagnosis not present

## 2024-02-17 DIAGNOSIS — N529 Male erectile dysfunction, unspecified: Secondary | ICD-10-CM | POA: Diagnosis not present

## 2024-02-17 DIAGNOSIS — M1990 Unspecified osteoarthritis, unspecified site: Secondary | ICD-10-CM | POA: Diagnosis not present

## 2024-02-17 DIAGNOSIS — E039 Hypothyroidism, unspecified: Secondary | ICD-10-CM | POA: Diagnosis not present

## 2024-02-17 DIAGNOSIS — Z7982 Long term (current) use of aspirin: Secondary | ICD-10-CM | POA: Diagnosis not present

## 2024-02-17 DIAGNOSIS — Z8249 Family history of ischemic heart disease and other diseases of the circulatory system: Secondary | ICD-10-CM | POA: Diagnosis not present

## 2024-02-17 DIAGNOSIS — G629 Polyneuropathy, unspecified: Secondary | ICD-10-CM | POA: Diagnosis not present

## 2024-02-17 DIAGNOSIS — M4800 Spinal stenosis, site unspecified: Secondary | ICD-10-CM | POA: Diagnosis not present

## 2024-02-17 DIAGNOSIS — G4700 Insomnia, unspecified: Secondary | ICD-10-CM | POA: Diagnosis not present

## 2024-03-24 DIAGNOSIS — E039 Hypothyroidism, unspecified: Secondary | ICD-10-CM | POA: Diagnosis not present

## 2024-05-05 DIAGNOSIS — L905 Scar conditions and fibrosis of skin: Secondary | ICD-10-CM | POA: Diagnosis not present

## 2024-05-05 DIAGNOSIS — D225 Melanocytic nevi of trunk: Secondary | ICD-10-CM | POA: Diagnosis not present

## 2024-05-05 DIAGNOSIS — D2261 Melanocytic nevi of right upper limb, including shoulder: Secondary | ICD-10-CM | POA: Diagnosis not present

## 2024-05-05 DIAGNOSIS — D2262 Melanocytic nevi of left upper limb, including shoulder: Secondary | ICD-10-CM | POA: Diagnosis not present

## 2024-05-05 DIAGNOSIS — L570 Actinic keratosis: Secondary | ICD-10-CM | POA: Diagnosis not present

## 2024-05-05 DIAGNOSIS — Z85820 Personal history of malignant melanoma of skin: Secondary | ICD-10-CM | POA: Diagnosis not present

## 2024-05-05 DIAGNOSIS — L821 Other seborrheic keratosis: Secondary | ICD-10-CM | POA: Diagnosis not present

## 2024-05-05 DIAGNOSIS — Z85828 Personal history of other malignant neoplasm of skin: Secondary | ICD-10-CM | POA: Diagnosis not present

## 2024-05-05 DIAGNOSIS — L814 Other melanin hyperpigmentation: Secondary | ICD-10-CM | POA: Diagnosis not present

## 2024-06-24 ENCOUNTER — Other Ambulatory Visit: Payer: Self-pay | Admitting: Gastroenterology

## 2024-06-24 DIAGNOSIS — Z860101 Personal history of adenomatous and serrated colon polyps: Secondary | ICD-10-CM | POA: Diagnosis not present

## 2024-06-24 DIAGNOSIS — K648 Other hemorrhoids: Secondary | ICD-10-CM | POA: Diagnosis not present

## 2024-06-24 DIAGNOSIS — Z09 Encounter for follow-up examination after completed treatment for conditions other than malignant neoplasm: Secondary | ICD-10-CM | POA: Diagnosis not present

## 2024-06-24 DIAGNOSIS — D123 Benign neoplasm of transverse colon: Secondary | ICD-10-CM | POA: Diagnosis not present

## 2024-06-24 DIAGNOSIS — K5730 Diverticulosis of large intestine without perforation or abscess without bleeding: Secondary | ICD-10-CM | POA: Diagnosis not present

## 2024-06-28 DIAGNOSIS — D123 Benign neoplasm of transverse colon: Secondary | ICD-10-CM | POA: Diagnosis not present

## 2024-07-11 ENCOUNTER — Encounter: Payer: Self-pay | Admitting: Gastroenterology

## 2024-07-13 ENCOUNTER — Ambulatory Visit
Admission: RE | Admit: 2024-07-13 | Discharge: 2024-07-13 | Disposition: A | Discharge status: Home / Self Care | Source: Ambulatory Visit | Attending: Gastroenterology

## 2024-07-13 DIAGNOSIS — Z860101 Personal history of adenomatous and serrated colon polyps: Secondary | ICD-10-CM

## 2024-07-13 DIAGNOSIS — N132 Hydronephrosis with renal and ureteral calculous obstruction: Secondary | ICD-10-CM | POA: Diagnosis not present

## 2024-08-15 DIAGNOSIS — G4733 Obstructive sleep apnea (adult) (pediatric): Secondary | ICD-10-CM | POA: Diagnosis not present

## 2024-10-17 DIAGNOSIS — N130 Hydronephrosis with ureteropelvic junction obstruction: Secondary | ICD-10-CM | POA: Diagnosis not present

## 2024-10-21 ENCOUNTER — Other Ambulatory Visit (HOSPITAL_COMMUNITY): Payer: Self-pay | Admitting: Urology

## 2024-10-21 DIAGNOSIS — N1330 Unspecified hydronephrosis: Secondary | ICD-10-CM

## 2024-11-02 ENCOUNTER — Ambulatory Visit (HOSPITAL_COMMUNITY): Admission: RE | Admit: 2024-11-02 | Discharge: 2024-11-02 | Attending: Urology

## 2024-11-02 DIAGNOSIS — N1330 Unspecified hydronephrosis: Secondary | ICD-10-CM | POA: Insufficient documentation

## 2024-11-02 MED ORDER — TECHNETIUM TC 99M MERTIATIDE
5.0000 | Freq: Once | INTRAVENOUS | Status: AC | PRN
  Administered 2024-11-02: 13:00:00 5.2 via INTRAVENOUS

## 2024-11-02 MED ORDER — FUROSEMIDE 10 MG/ML IJ SOLN
INTRAMUSCULAR | Status: AC
  Filled 2024-11-02: qty 8

## 2024-11-02 MED ORDER — FUROSEMIDE 10 MG/ML IJ SOLN
41.3000 mg | Freq: Once | INTRAMUSCULAR | Status: AC
  Administered 2024-11-02: 14:00:00 41.3 mg via INTRAVENOUS

## 2024-11-08 ENCOUNTER — Other Ambulatory Visit: Payer: Self-pay | Admitting: Internal Medicine

## 2024-11-08 DIAGNOSIS — J920 Pleural plaque with presence of asbestos: Secondary | ICD-10-CM
